# Patient Record
Sex: Female | Born: 1955 | Race: White | Hispanic: No
Health system: Southern US, Community
[De-identification: ages and names within clinical notes are randomized; demographics above are authoritative.]

---

## 2017-04-12 ENCOUNTER — Other Ambulatory Visit (HOSPITAL_COMMUNITY): Payer: Medicare Other

## 2017-04-12 ENCOUNTER — Ambulatory Visit (HOSPITAL_COMMUNITY)
Admission: AD | Admit: 2017-04-12 | Discharge: 2017-04-12 | Disposition: A | Discharge status: Home / Self Care | Payer: Medicare Other | Source: Other Acute Inpatient Hospital | Attending: Internal Medicine | Admitting: Internal Medicine

## 2017-04-12 ENCOUNTER — Inpatient Hospital Stay
Admission: AD | Admit: 2017-04-12 | Discharge: 2017-05-19 | Disposition: A | Discharge status: Skilled Nursing Facility | Payer: Medicare Other | Source: Ambulatory Visit | Attending: Internal Medicine | Admitting: Internal Medicine

## 2017-04-12 DIAGNOSIS — J9690 Respiratory failure, unspecified, unspecified whether with hypoxia or hypercapnia: Secondary | ICD-10-CM

## 2017-04-12 DIAGNOSIS — R0603 Acute respiratory distress: Secondary | ICD-10-CM

## 2017-04-12 DIAGNOSIS — Z4659 Encounter for fitting and adjustment of other gastrointestinal appliance and device: Secondary | ICD-10-CM

## 2017-04-12 DIAGNOSIS — Z0189 Encounter for other specified special examinations: Secondary | ICD-10-CM

## 2017-04-12 DIAGNOSIS — R092 Respiratory arrest: Secondary | ICD-10-CM | POA: Diagnosis present

## 2017-04-12 DIAGNOSIS — Z978 Presence of other specified devices: Secondary | ICD-10-CM

## 2017-04-12 DIAGNOSIS — Z431 Encounter for attention to gastrostomy: Secondary | ICD-10-CM

## 2017-04-12 DIAGNOSIS — R109 Unspecified abdominal pain: Secondary | ICD-10-CM

## 2017-04-12 LAB — VANCOMYCIN, RANDOM: VANCOMYCIN RM: 8

## 2017-04-12 LAB — BLOOD GAS, ARTERIAL
ACID-BASE DEFICIT: 4.7 mmol/L — AB (ref 0.0–2.0)
BICARBONATE: 19.2 mmol/L — AB (ref 20.0–28.0)
FIO2: 28
MECHVT: 500 mL
O2 SAT: 96.9 %
PCO2 ART: 31.4 mmHg — AB (ref 32.0–48.0)
PEEP: 5 cmH2O
PO2 ART: 87.8 mmHg (ref 83.0–108.0)
Patient temperature: 98.6
pH, Arterial: 7.404 (ref 7.350–7.450)

## 2017-04-13 LAB — CBC WITH DIFFERENTIAL/PLATELET
BASOS PCT: 0 %
Basophils Absolute: 0 10*3/uL (ref 0.0–0.1)
EOS PCT: 5 %
Eosinophils Absolute: 0.6 10*3/uL (ref 0.0–0.7)
HEMATOCRIT: 29.2 % — AB (ref 36.0–46.0)
Hemoglobin: 9.2 g/dL — ABNORMAL LOW (ref 12.0–15.0)
LYMPHS PCT: 17 %
Lymphs Abs: 2 10*3/uL (ref 0.7–4.0)
MCH: 27.5 pg (ref 26.0–34.0)
MCHC: 31.5 g/dL (ref 30.0–36.0)
MCV: 87.2 fL (ref 78.0–100.0)
Monocytes Absolute: 0.8 10*3/uL (ref 0.1–1.0)
Monocytes Relative: 7 %
NEUTROS ABS: 8.5 10*3/uL — AB (ref 1.7–7.7)
NEUTROS PCT: 71 %
Platelets: 313 10*3/uL (ref 150–400)
RBC: 3.35 MIL/uL — ABNORMAL LOW (ref 3.87–5.11)
RDW: 14.1 % (ref 11.5–15.5)
WBC: 11.9 10*3/uL — ABNORMAL HIGH (ref 4.0–10.5)

## 2017-04-13 LAB — C DIFFICILE QUICK SCREEN W PCR REFLEX
C DIFFICILE (CDIFF) TOXIN: NEGATIVE
C DIFFICLE (CDIFF) ANTIGEN: POSITIVE — AB

## 2017-04-13 LAB — COMPREHENSIVE METABOLIC PANEL
ALBUMIN: 2.2 g/dL — AB (ref 3.5–5.0)
ALK PHOS: 62 U/L (ref 38–126)
ALT: 25 U/L (ref 14–54)
ANION GAP: 9 (ref 5–15)
AST: 19 U/L (ref 15–41)
BUN: <5 mg/dL — ABNORMAL LOW (ref 6–20)
CHLORIDE: 113 mmol/L — AB (ref 101–111)
CO2: 20 mmol/L — AB (ref 22–32)
CREATININE: 0.69 mg/dL (ref 0.44–1.00)
Calcium: 7.9 mg/dL — ABNORMAL LOW (ref 8.9–10.3)
GFR calc non Af Amer: >60 mL/min (ref >60)
GLUCOSE: 121 mg/dL — AB (ref 65–99)
Potassium: 3.5 mmol/L (ref 3.5–5.1)
SODIUM: 142 mmol/L (ref 135–145)
Total Bilirubin: 0.6 mg/dL (ref 0.3–1.2)
Total Protein: 5.1 g/dL — ABNORMAL LOW (ref 6.5–8.1)

## 2017-04-13 LAB — PROTIME-INR
INR: 1.26
Prothrombin Time: 15.9 seconds — ABNORMAL HIGH (ref 11.4–15.2)

## 2017-04-13 LAB — CLOSTRIDIUM DIFFICILE BY PCR: CDIFFPCR: POSITIVE — AB

## 2017-04-14 LAB — VANCOMYCIN, TROUGH: Vancomycin Tr: 20 ug/mL (ref 15–20)

## 2017-04-15 LAB — BASIC METABOLIC PANEL
Anion gap: 10 (ref 5–15)
BUN: 6 mg/dL (ref 6–20)
CALCIUM: 8.4 mg/dL — AB (ref 8.9–10.3)
CO2: 22 mmol/L (ref 22–32)
CREATININE: 1.38 mg/dL — AB (ref 0.44–1.00)
Chloride: 116 mmol/L — ABNORMAL HIGH (ref 101–111)
GFR calc non Af Amer: 40 mL/min — ABNORMAL LOW (ref >60)
GFR, EST AFRICAN AMERICAN: 46 mL/min — AB (ref >60)
Glucose, Bld: 125 mg/dL — ABNORMAL HIGH (ref 65–99)
Potassium: 3.9 mmol/L (ref 3.5–5.1)
Sodium: 148 mmol/L — ABNORMAL HIGH (ref 135–145)

## 2017-04-15 LAB — CBC WITH DIFFERENTIAL/PLATELET
BASOS PCT: 0 %
Basophils Absolute: 0 10*3/uL (ref 0.0–0.1)
EOS ABS: 0.6 10*3/uL (ref 0.0–0.7)
Eosinophils Relative: 6 %
HCT: 33.9 % — ABNORMAL LOW (ref 36.0–46.0)
Hemoglobin: 10.7 g/dL — ABNORMAL LOW (ref 12.0–15.0)
Lymphocytes Relative: 15 %
Lymphs Abs: 1.4 10*3/uL (ref 0.7–4.0)
MCH: 27.9 pg (ref 26.0–34.0)
MCHC: 31.6 g/dL (ref 30.0–36.0)
MCV: 88.5 fL (ref 78.0–100.0)
MONO ABS: 1 10*3/uL (ref 0.1–1.0)
Monocytes Relative: 10 %
NEUTROS PCT: 69 %
Neutro Abs: 6.5 10*3/uL (ref 1.7–7.7)
PLATELETS: 306 10*3/uL (ref 150–400)
RBC: 3.83 MIL/uL — AB (ref 3.87–5.11)
RDW: 14.8 % (ref 11.5–15.5)
WBC: 9.5 10*3/uL (ref 4.0–10.5)

## 2017-04-15 LAB — MAGNESIUM: MAGNESIUM: 1.9 mg/dL (ref 1.7–2.4)

## 2017-04-16 LAB — VANCOMYCIN, TROUGH: VANCOMYCIN TR: 45 ug/mL — AB (ref 15–20)

## 2017-04-18 LAB — BLOOD GAS, ARTERIAL
Acid-Base Excess: 2.9 mmol/L — ABNORMAL HIGH (ref 0.0–2.0)
Bicarbonate: 27.4 mmol/L (ref 20.0–28.0)
FIO2: 28
MECHVT: 450 mL
O2 SAT: 95 %
PATIENT TEMPERATURE: 99.6
PCO2 ART: 46.4 mmHg (ref 32.0–48.0)
PEEP: 5 cmH2O
PH ART: 7.392 (ref 7.350–7.450)
PO2 ART: 86.6 mmHg (ref 83.0–108.0)
RATE: 16 resp/min

## 2017-04-18 LAB — BASIC METABOLIC PANEL
Anion gap: 10 (ref 5–15)
BUN: 8 mg/dL (ref 6–20)
CHLORIDE: 114 mmol/L — AB (ref 101–111)
CO2: 28 mmol/L (ref 22–32)
CREATININE: 1.9 mg/dL — AB (ref 0.44–1.00)
Calcium: 8.6 mg/dL — ABNORMAL LOW (ref 8.9–10.3)
GFR calc Af Amer: 31 mL/min — ABNORMAL LOW (ref >60)
GFR calc non Af Amer: 27 mL/min — ABNORMAL LOW (ref >60)
GLUCOSE: 126 mg/dL — AB (ref 65–99)
POTASSIUM: 3 mmol/L — AB (ref 3.5–5.1)
Sodium: 152 mmol/L — ABNORMAL HIGH (ref 135–145)

## 2017-04-18 LAB — CBC
HCT: 29.2 % — ABNORMAL LOW (ref 36.0–46.0)
HEMOGLOBIN: 8.8 g/dL — AB (ref 12.0–15.0)
MCH: 26.8 pg (ref 26.0–34.0)
MCHC: 30.1 g/dL (ref 30.0–36.0)
MCV: 89 fL (ref 78.0–100.0)
Platelets: 317 10*3/uL (ref 150–400)
RBC: 3.28 MIL/uL — AB (ref 3.87–5.11)
RDW: 14.5 % (ref 11.5–15.5)
WBC: 8.7 10*3/uL (ref 4.0–10.5)

## 2017-04-18 LAB — VANCOMYCIN, TROUGH: VANCOMYCIN TR: 15 ug/mL (ref 15–20)

## 2017-04-19 LAB — BASIC METABOLIC PANEL
Anion gap: 15 (ref 5–15)
BUN: 8 mg/dL (ref 6–20)
CO2: 22 mmol/L (ref 22–32)
CREATININE: 1.89 mg/dL — AB (ref 0.44–1.00)
Calcium: 8.6 mg/dL — ABNORMAL LOW (ref 8.9–10.3)
Chloride: 115 mmol/L — ABNORMAL HIGH (ref 101–111)
GFR calc non Af Amer: 27 mL/min — ABNORMAL LOW (ref >60)
GFR, EST AFRICAN AMERICAN: 32 mL/min — AB (ref >60)
Glucose, Bld: 117 mg/dL — ABNORMAL HIGH (ref 65–99)
Potassium: 4.4 mmol/L (ref 3.5–5.1)
SODIUM: 152 mmol/L — AB (ref 135–145)

## 2017-04-20 LAB — BASIC METABOLIC PANEL
Anion gap: 9 (ref 5–15)
BUN: 11 mg/dL (ref 6–20)
CHLORIDE: 109 mmol/L (ref 101–111)
CO2: 28 mmol/L (ref 22–32)
Calcium: 8.2 mg/dL — ABNORMAL LOW (ref 8.9–10.3)
Creatinine, Ser: 1.67 mg/dL — ABNORMAL HIGH (ref 0.44–1.00)
GFR calc Af Amer: 37 mL/min — ABNORMAL LOW (ref >60)
GFR calc non Af Amer: 32 mL/min — ABNORMAL LOW (ref >60)
GLUCOSE: 129 mg/dL — AB (ref 65–99)
POTASSIUM: 2.7 mmol/L — AB (ref 3.5–5.1)
Sodium: 146 mmol/L — ABNORMAL HIGH (ref 135–145)

## 2017-04-20 LAB — VANCOMYCIN, TROUGH: VANCOMYCIN TR: 35 ug/mL — AB (ref 15–20)

## 2017-04-21 ENCOUNTER — Other Ambulatory Visit (HOSPITAL_COMMUNITY): Payer: Medicare Other

## 2017-04-21 LAB — POTASSIUM: Potassium: 3.5 mmol/L (ref 3.5–5.1)

## 2017-04-21 LAB — VANCOMYCIN, TROUGH: VANCOMYCIN TR: 16 ug/mL (ref 15–20)

## 2017-04-22 ENCOUNTER — Other Ambulatory Visit (HOSPITAL_COMMUNITY): Payer: Medicare Other

## 2017-04-22 LAB — CBC
HEMATOCRIT: 29.1 % — AB (ref 36.0–46.0)
Hemoglobin: 9.1 g/dL — ABNORMAL LOW (ref 12.0–15.0)
MCH: 27.2 pg (ref 26.0–34.0)
MCHC: 31.3 g/dL (ref 30.0–36.0)
MCV: 87.1 fL (ref 78.0–100.0)
PLATELETS: 321 10*3/uL (ref 150–400)
RBC: 3.34 MIL/uL — AB (ref 3.87–5.11)
RDW: 14.2 % (ref 11.5–15.5)
WBC: 8.1 10*3/uL (ref 4.0–10.5)

## 2017-04-22 LAB — BASIC METABOLIC PANEL
ANION GAP: 10 (ref 5–15)
BUN: 18 mg/dL (ref 6–20)
CO2: 26 mmol/L (ref 22–32)
Calcium: 9 mg/dL (ref 8.9–10.3)
Chloride: 110 mmol/L (ref 101–111)
Creatinine, Ser: 1.57 mg/dL — ABNORMAL HIGH (ref 0.44–1.00)
GFR calc Af Amer: 39 mL/min — ABNORMAL LOW (ref >60)
GFR, EST NON AFRICAN AMERICAN: 34 mL/min — AB (ref >60)
GLUCOSE: 116 mg/dL — AB (ref 65–99)
POTASSIUM: 3.3 mmol/L — AB (ref 3.5–5.1)
Sodium: 146 mmol/L — ABNORMAL HIGH (ref 135–145)

## 2017-04-22 LAB — TSH: TSH: 1.404 u[IU]/mL (ref 0.350–4.500)

## 2017-04-23 NOTE — Anesthesia Preprocedure Evaluation (Addendum)
Anesthesia Evaluation  Patient identified by MRN, date of birth, ID band Patient unresponsive    Reviewed: Allergy & Precautions, H&P , NPO status , Patient's Chart, lab work & pertinent test results  Airway Mallampati: Intubated       Dental no notable dental hx. (+) Teeth Intact, Dental Advisory Given   Pulmonary neg pulmonary ROS,  VDRF   Pulmonary exam normal breath sounds clear to auscultation       Cardiovascular Exercise Tolerance: Good negative cardio ROS   Rhythm:Regular Rate:Normal     Neuro/Psych PSYCHIATRIC DISORDERS Depression Bipolar Disorder negative neurological ROS     GI/Hepatic negative GI ROS, Neg liver ROS,   Endo/Other  negative endocrine ROS  Renal/GU negative Renal ROS  negative genitourinary   Musculoskeletal   Abdominal   Peds  Hematology negative hematology ROS (+)   Anesthesia Other Findings   Reproductive/Obstetrics negative OB ROS                            Anesthesia Physical Anesthesia Plan  ASA: IV  Anesthesia Plan: General   Post-op Pain Management:    Induction: Intravenous  PONV Risk Score and Plan: 3 and Ondansetron, Dexamethasone and Midazolam  Airway Management Planned: Tracheostomy  Additional Equipment:   Intra-op Plan:   Post-operative Plan: Post-operative intubation/ventilation  Informed Consent: I have reviewed the patients History and Physical, chart, labs and discussed the procedure including the risks, benefits and alternatives for the proposed anesthesia with the patient or authorized representative who has indicated his/her understanding and acceptance.   Dental advisory given  Plan Discussed with: CRNA  Anesthesia Plan Comments:        Anesthesia Quick Evaluation

## 2017-04-24 ENCOUNTER — Encounter
Admission: AD | Disposition: A | Discharge status: Skilled Nursing Facility | Payer: Self-pay | Source: Ambulatory Visit | Attending: Internal Medicine

## 2017-04-24 ENCOUNTER — Encounter (HOSPITAL_COMMUNITY): Payer: Medicare Other | Admitting: Anesthesiology

## 2017-04-24 HISTORY — PX: TRACHEOSTOMY TUBE PLACEMENT: SHX814

## 2017-04-24 LAB — CBC
HCT: 31 % — ABNORMAL LOW (ref 36.0–46.0)
Hemoglobin: 9.6 g/dL — ABNORMAL LOW (ref 12.0–15.0)
MCH: 27.4 pg (ref 26.0–34.0)
MCHC: 31 g/dL (ref 30.0–36.0)
MCV: 88.3 fL (ref 78.0–100.0)
PLATELETS: 440 10*3/uL — AB (ref 150–400)
RBC: 3.51 MIL/uL — AB (ref 3.87–5.11)
RDW: 14.3 % (ref 11.5–15.5)
WBC: 10.6 10*3/uL — AB (ref 4.0–10.5)

## 2017-04-24 LAB — PROTIME-INR
INR: 1.11
PROTHROMBIN TIME: 14.3 s (ref 11.4–15.2)

## 2017-04-24 LAB — BASIC METABOLIC PANEL
ANION GAP: 10 (ref 5–15)
BUN: 19 mg/dL (ref 6–20)
CO2: 28 mmol/L (ref 22–32)
Calcium: 9 mg/dL (ref 8.9–10.3)
Chloride: 108 mmol/L (ref 101–111)
Creatinine, Ser: 1.52 mg/dL — ABNORMAL HIGH (ref 0.44–1.00)
GFR calc Af Amer: 41 mL/min — ABNORMAL LOW (ref >60)
GFR, EST NON AFRICAN AMERICAN: 35 mL/min — AB (ref >60)
Glucose, Bld: 117 mg/dL — ABNORMAL HIGH (ref 65–99)
POTASSIUM: 3.4 mmol/L — AB (ref 3.5–5.1)
SODIUM: 146 mmol/L — AB (ref 135–145)

## 2017-04-24 SURGERY — CREATION, TRACHEOSTOMY
Anesthesia: General | Site: Neck

## 2017-04-24 MED ORDER — ONDANSETRON HCL 4 MG/2ML IJ SOLN
INTRAMUSCULAR | Status: DC | PRN
  Administered 2017-04-24: 4 mg via INTRAVENOUS

## 2017-04-24 MED ORDER — PROPOFOL 10 MG/ML IV BOLUS
INTRAVENOUS | Status: DC | PRN
  Administered 2017-04-24 (×2): 50 mg via INTRAVENOUS

## 2017-04-24 MED ORDER — SODIUM CHLORIDE 0.9 % IV SOLN
INTRAVENOUS | Status: DC | PRN
  Administered 2017-04-24: 08:00:00 via INTRAVENOUS

## 2017-04-24 MED ORDER — PHENYLEPHRINE 40 MCG/ML (10ML) SYRINGE FOR IV PUSH (FOR BLOOD PRESSURE SUPPORT)
PREFILLED_SYRINGE | INTRAVENOUS | Status: AC
  Filled 2017-04-24: qty 10

## 2017-04-24 MED ORDER — PHENYLEPHRINE 40 MCG/ML (10ML) SYRINGE FOR IV PUSH (FOR BLOOD PRESSURE SUPPORT)
PREFILLED_SYRINGE | INTRAVENOUS | Status: DC | PRN
  Administered 2017-04-24: 80 ug via INTRAVENOUS

## 2017-04-24 MED ORDER — LIDOCAINE-EPINEPHRINE (PF) 1 %-1:200000 IJ SOLN
INTRAMUSCULAR | Status: DC | PRN
  Administered 2017-04-24: 30 mL

## 2017-04-24 MED ORDER — MIDAZOLAM HCL 2 MG/2ML IJ SOLN
INTRAMUSCULAR | Status: DC | PRN
  Administered 2017-04-24: 2 mg via INTRAVENOUS

## 2017-04-24 MED ORDER — MIDAZOLAM HCL 2 MG/2ML IJ SOLN
INTRAMUSCULAR | Status: AC
Start: 2017-04-24 — End: ?
  Filled 2017-04-24: qty 2

## 2017-04-24 MED ORDER — ROCURONIUM BROMIDE 10 MG/ML (PF) SYRINGE
PREFILLED_SYRINGE | INTRAVENOUS | Status: AC
  Filled 2017-04-24: qty 5

## 2017-04-24 MED ORDER — PROPOFOL 10 MG/ML IV BOLUS
INTRAVENOUS | Status: AC
  Filled 2017-04-24: qty 20

## 2017-04-24 MED ORDER — FENTANYL CITRATE (PF) 250 MCG/5ML IJ SOLN
INTRAMUSCULAR | Status: AC
  Filled 2017-04-24: qty 5

## 2017-04-24 MED ORDER — LIDOCAINE 2% (20 MG/ML) 5 ML SYRINGE
INTRAMUSCULAR | Status: AC
  Filled 2017-04-24: qty 5

## 2017-04-24 MED ORDER — ROCURONIUM BROMIDE 10 MG/ML (PF) SYRINGE
PREFILLED_SYRINGE | INTRAVENOUS | Status: DC | PRN
  Administered 2017-04-24: 50 mg via INTRAVENOUS

## 2017-04-24 MED ORDER — 0.9 % SODIUM CHLORIDE (POUR BTL) OPTIME
TOPICAL | Status: DC | PRN
  Administered 2017-04-24: 1000 mL

## 2017-04-24 MED ORDER — FENTANYL CITRATE (PF) 250 MCG/5ML IJ SOLN
INTRAMUSCULAR | Status: DC | PRN
  Administered 2017-04-24 (×2): 25 ug via INTRAVENOUS
  Administered 2017-04-24 (×2): 50 ug via INTRAVENOUS

## 2017-04-24 SURGICAL SUPPLY — 38 items
ATTRACTOMAT 16X20 MAGNETIC DRP (DRAPES) IMPLANT
BLADE SURG 15 STRL LF DISP TIS (BLADE) ×1 IMPLANT
BLADE SURG 15 STRL SS (BLADE) ×2
CLEANER TIP ELECTROSURG 2X2 (MISCELLANEOUS) ×3 IMPLANT
COVER SURGICAL LIGHT HANDLE (MISCELLANEOUS) ×3 IMPLANT
DRAPE HALF SHEET 40X57 (DRAPES) IMPLANT
ELECT COATED BLADE 2.86 ST (ELECTRODE) ×3 IMPLANT
ELECT REM PT RETURN 9FT ADLT (ELECTROSURGICAL) ×3
ELECTRODE REM PT RTRN 9FT ADLT (ELECTROSURGICAL) ×1 IMPLANT
GAUZE SPONGE 4X4 16PLY XRAY LF (GAUZE/BANDAGES/DRESSINGS) ×3 IMPLANT
GEL ULTRASOUND 20GR AQUASONIC (MISCELLANEOUS) ×3 IMPLANT
GLOVE SS BIOGEL STRL SZ 7.5 (GLOVE) ×1 IMPLANT
GLOVE SUPERSENSE BIOGEL SZ 7.5 (GLOVE) ×2
GOWN STRL REUS W/ TWL LRG LVL3 (GOWN DISPOSABLE) ×1 IMPLANT
GOWN STRL REUS W/ TWL XL LVL3 (GOWN DISPOSABLE) ×1 IMPLANT
GOWN STRL REUS W/TWL LRG LVL3 (GOWN DISPOSABLE) ×2
GOWN STRL REUS W/TWL XL LVL3 (GOWN DISPOSABLE) ×2
HOLDER TRACH TUBE VELCRO 19.5 (MISCELLANEOUS) ×3 IMPLANT
KIT BASIN OR (CUSTOM PROCEDURE TRAY) ×3 IMPLANT
KIT ROOM TURNOVER OR (KITS) ×3 IMPLANT
KIT SUCTION CATH 14FR (SUCTIONS) ×3 IMPLANT
NEEDLE HYPO 25GX1X1/2 BEV (NEEDLE) ×3 IMPLANT
NS IRRIG 1000ML POUR BTL (IV SOLUTION) ×3 IMPLANT
PACK EENT II TURBAN DRAPE (CUSTOM PROCEDURE TRAY) ×3 IMPLANT
PAD ARMBOARD 7.5X6 YLW CONV (MISCELLANEOUS) IMPLANT
PENCIL BUTTON HOLSTER BLD 10FT (ELECTRODE) ×3 IMPLANT
SPONGE DRAIN TRACH 4X4 STRL 2S (GAUZE/BANDAGES/DRESSINGS) ×3 IMPLANT
SPONGE INTESTINAL PEANUT (DISPOSABLE) ×3 IMPLANT
SUT SILK 2 0 SH CR/8 (SUTURE) ×3 IMPLANT
SUT SILK 3 0 TIES 10X30 (SUTURE) IMPLANT
SYR 5ML LUER SLIP (SYRINGE) ×3 IMPLANT
SYR CONTROL 10ML LL (SYRINGE) ×3 IMPLANT
TOWEL OR 17X24 6PK STRL BLUE (TOWEL DISPOSABLE) ×3 IMPLANT
TOWEL OR 17X26 10 PK STRL BLUE (TOWEL DISPOSABLE) ×3 IMPLANT
TUBE CONNECTING 12'X1/4 (SUCTIONS) ×1
TUBE CONNECTING 12X1/4 (SUCTIONS) ×2 IMPLANT
TUBE TRACH SHILEY  6 DIST  CUF (TUBING) ×3 IMPLANT
TUBE TRACH SHILEY 8 DIST CUF (TUBING) IMPLANT

## 2017-04-24 NOTE — Transfer of Care (Addendum)
Immediate Anesthesia Transfer of Care Note  Patient: Suzanne Barrera  Procedure(s) Performed: Procedure(s): TRACHEOSTOMY (N/A)  [facility] Location[facility]  Anesthesia Type:General  Level of Consciousness: drowsy and patient cooperative  Airway & Oxygen Therapy: Patient remains intubated per anesthesia plan and Patient placed on Ventilator (see vital sign flow sheet for setting)  Post-op Assessment: Report given to RN, Post -op Vital signs reviewed and stable and Patient moving all extremities X 4  Post vital signs: Reviewed and stable  Last Vitals: There were no vitals filed for this visit.  Last Pain: There were no vitals filed for this visit.       Complications: No apparent anesthesia complications

## 2017-04-24 NOTE — H&P (Signed)
PREOPERATIVE H&P  Chief Complaint: Acute respiratory failure  HPI: Suzanne Barrera is a 61 y.o. female who presents for evaluation for tracheostomy. She has bipolar disorder with history of multiple suicide attempts. She recently tool overdose of medication in [place] requiring hospitalization and developed MRSA pneumonia. She was transferred to [facility] 8/1 intubated on vent. It was recommended that she has a trach placed and family is agreeable as she's not totally responsive.   No past medical history on file. No past surgical history on file. Social History   Social History  . Marital status: Widowed    Spouse name: N/A  . Number of children: N/A  . Years of education: N/A   Social History Main Topics  . Smoking status: Not on file  . Smokeless tobacco: Not on file  . Alcohol use Not on file  . Drug use: Unknown  . Sexual activity: Not on file   Other Topics Concern  . Not on file   Social History Narrative  . No narrative on file   No family history on file. Allergies not on file Prior to Admission medications   Not on File     Positive ROS: neg  All other systems have been reviewed and were otherwise negative with the exception of those mentioned in the HPI and as above.  Physical Exam: There were no vitals filed for this visit.  General: Intubated on vent Neck: No palpable adenopathy or thyroid nodules Cardiovascular: Regular rate and rhythm, no murmur.  Respiratory: Clear to auscultation Neurologic: Responsive but with encephalopathy   Assessment/Plan: respiratory failure Plan for Procedure(s): TRACHEOSTOMY   Dillard CannonHRISTOPHER Traycen Goyer, MD 04/24/2017 7:35 AM

## 2017-04-24 NOTE — Brief Op Note (Signed)
04/12/2017 - 04/24/2017  8:38 AM  PATIENT:  Suzanne Barrera  61 y.o. female  PRE-OPERATIVE DIAGNOSIS:  respiratory failure  POST-OPERATIVE DIAGNOSIS:  respiratory failure  PROCEDURE:  Procedure(s): TRACHEOSTOMY (N/A) #6 Shiley  SURGEON:  Surgeon(s) and Role:    Drema Halon* Natan Hartog E, MD - Primary  PHYSICIAN ASSISTANT:   ASSISTANTS: none   ANESTHESIA:   general  EBL:  No intake/output data recorded.  BLOOD ADMINISTERED:none  DRAINS: none   LOCAL MEDICATIONS USED:  XYLOCAINE with EPI 5 cc  SPECIMEN:  No Specimen  DISPOSITION OF SPECIMEN:  N/A  COUNTS:  YES  TOURNIQUET:  * No tourniquets in log *  DICTATION: .Other Dictation: Dictation Number [id number]  PLAN OF CARE: Discharge to home after PACU  PATIENT DISPOSITION:  PACU - hemodynamically stable.   Delay start of Pharmacological VTE agent (>24hrs) due to surgical blood loss or risk of bleeding: yes

## 2017-04-24 NOTE — OR Nursing (Signed)
0845: [facility] per crna x2/rn without incident. Trach obturator placed in visible peel pack, labeled, taped to wall over pt bed per or rn.

## 2017-04-24 NOTE — Interval H&P Note (Signed)
History and Physical Interval Note:  04/24/2017 7:42 AM  Suzanne Barrera  has presented today for surgery, with the diagnosis of respiratory failure  The various methods of treatment have been discussed with the patient and family. After consideration of risks, benefits and other options for treatment, the patient has consented to  Procedure(s): TRACHEOSTOMY (N/A) as a surgical intervention .  The patient's history has been reviewed, patient examined, no change in status, stable for surgery.  I have reviewed the patient's chart and labs.  Questions were answered to the patient's satisfaction.     CHRISTOPHER NEWMAN

## 2017-04-24 NOTE — Op Note (Signed)
NAMMercer Pod:  Barrera, Suzanne               ACCOUNT NO.:  [id number]  MEDICAL RECORD NO.:  [id number]  LOCATION:                                 FACILITY:  PHYSICIAN:  Kristine GarbeChristopher E. Ezzard StandingNewman, M.D. DATE OF BIRTH:  DATE OF PROCEDURE:  04/24/2017 DATE OF DISCHARGE:                              OPERATIVE REPORT   PREOPERATIVE DIAGNOSIS:  Acute respiratory failure.  POSTOPERATIVE DIAGNOSIS:  Acute respiratory failure.  OPERATION PERFORMED:  Tracheotomy with a #6 Shiley cuffed trach.  SURGEON:  Kristine GarbeChristopher E. Ezzard StandingNewman, M.D.  ANESTHESIA:  General.  COMPLICATIONS:  None.  BRIEF CLINICAL NOTE:  Suzanne HeadingsBenita Barrera is a 61 year old female with history of bipolar disorder as well as seizure disorder.  She has had history of multiple suicide attempts in the past and was found in [place], [place], unconscious having taken excessive medications consistent with overdose.  She subsequently developed MRSA pneumonia at the outside hospital and was subsequently [facility] 12, 2017, for long-term care.  She has been on the ventilator. It is recommended that she undergo tracheotomy and is taken to the operating room at this time for a tracheotomy.  DESCRIPTION OF PROCEDURE:  The patient was [facility] down to the OR, remained in her bed.  A roll was placed underneath her shoulders to extend her neck.  The trachea was midline with no surrounding masses.  Area was prepped with Betadine solution and draped out with sterile towels.  The area was injected with 5 mL of Xylocaine with epinephrine for hemostasis and local anesthetic. A vertical incision was made just above the suprasternal notch at midline.  Dissection was carried down through subcutaneous tissue with cautery.  The strap muscles were identified and divided in midline and retracted laterally.  The cricoid cartilage was identified and the first three tracheal rings were  exposed having divided the thyroid isthmus with the cautery.  A horizontal tracheotomy was performed between the first and second tracheal rings and a #6 Shiley cuffed tube was inserted after withdrawing the endotracheal tube.  The patient was ventilated well.  The trach was secured to the neck with 2-0 silk sutures x4 and Velcro trach collar around the neck.  The patient was subsequently [facility] with a #6 cuffed Shiley trach in place functioning well.    ______________________________ Kristine Garbehristopher E. Ezzard StandingNewman, M.D.   ______________________________ Kristine Garbehristopher E. Ezzard StandingNewman, M.D.    CEN/MEDQ  D:  04/24/2017  T:  04/24/2017  Job:  [id number]

## 2017-04-24 NOTE — Anesthesia Postprocedure Evaluation (Signed)
Anesthesia Post Note  Patient: Suzanne Barrera  Procedure(s) Performed: Procedure(s) (LRB): TRACHEOSTOMY (N/A)     [facility]. Anesthesia Type: General Level of consciousness: sedated and patient remains intubated per anesthesia plan Pain management: pain level controlled Vital Signs Assessment: post-procedure vital signs reviewed and stable Respiratory status: respiratory function stable, nonlabored ventilation and patient on ventilator - see flowsheet for VS Cardiovascular status: blood pressure returned to baseline and stable Postop Assessment: no signs of nausea or vomiting Anesthetic complications: no    Last Vitals: There were no vitals filed for this visit.  Last Pain: There were no vitals filed for this visit.               Ubah Radke,W. EDMOND

## 2017-04-25 ENCOUNTER — Encounter (HOSPITAL_COMMUNITY): Payer: Self-pay | Admitting: Otolaryngology

## 2017-04-28 ENCOUNTER — Other Ambulatory Visit (HOSPITAL_COMMUNITY): Payer: Medicare Other

## 2017-04-29 ENCOUNTER — Other Ambulatory Visit (HOSPITAL_COMMUNITY): Payer: Medicare Other

## 2017-04-30 LAB — BASIC METABOLIC PANEL
ANION GAP: 12 (ref 5–15)
BUN: 30 mg/dL — ABNORMAL HIGH (ref 6–20)
CALCIUM: 9.2 mg/dL (ref 8.9–10.3)
CO2: 27 mmol/L (ref 22–32)
Chloride: 108 mmol/L (ref 101–111)
Creatinine, Ser: 1.48 mg/dL — ABNORMAL HIGH (ref 0.44–1.00)
GFR, EST AFRICAN AMERICAN: 42 mL/min — AB (ref >60)
GFR, EST NON AFRICAN AMERICAN: 37 mL/min — AB (ref >60)
Glucose, Bld: 126 mg/dL — ABNORMAL HIGH (ref 65–99)
Potassium: 4.2 mmol/L (ref 3.5–5.1)
SODIUM: 147 mmol/L — AB (ref 135–145)

## 2017-04-30 LAB — CBC WITH DIFFERENTIAL/PLATELET
BASOS ABS: 0.1 10*3/uL (ref 0.0–0.1)
BASOS PCT: 0 %
EOS ABS: 0.9 10*3/uL — AB (ref 0.0–0.7)
Eosinophils Relative: 7 %
HEMATOCRIT: 34.5 % — AB (ref 36.0–46.0)
HEMOGLOBIN: 10.5 g/dL — AB (ref 12.0–15.0)
Lymphocytes Relative: 20 %
Lymphs Abs: 2.7 10*3/uL (ref 0.7–4.0)
MCH: 27 pg (ref 26.0–34.0)
MCHC: 30.4 g/dL (ref 30.0–36.0)
MCV: 88.7 fL (ref 78.0–100.0)
Monocytes Absolute: 1.3 10*3/uL — ABNORMAL HIGH (ref 0.1–1.0)
Monocytes Relative: 9 %
NEUTROS ABS: 8.7 10*3/uL — AB (ref 1.7–7.7)
NEUTROS PCT: 64 %
Platelets: 769 10*3/uL — ABNORMAL HIGH (ref 150–400)
RBC: 3.89 MIL/uL (ref 3.87–5.11)
RDW: 14.1 % (ref 11.5–15.5)
WBC: 13.7 10*3/uL — AB (ref 4.0–10.5)

## 2017-05-01 LAB — BASIC METABOLIC PANEL
Anion gap: 10 (ref 5–15)
BUN: 34 mg/dL — AB (ref 6–20)
CHLORIDE: 111 mmol/L (ref 101–111)
CO2: 25 mmol/L (ref 22–32)
CREATININE: 1.5 mg/dL — AB (ref 0.44–1.00)
Calcium: 9 mg/dL (ref 8.9–10.3)
GFR calc non Af Amer: 36 mL/min — ABNORMAL LOW (ref >60)
GFR, EST AFRICAN AMERICAN: 42 mL/min — AB (ref >60)
Glucose, Bld: 127 mg/dL — ABNORMAL HIGH (ref 65–99)
POTASSIUM: 6 mmol/L — AB (ref 3.5–5.1)
Sodium: 146 mmol/L — ABNORMAL HIGH (ref 135–145)

## 2017-05-02 ENCOUNTER — Other Ambulatory Visit (HOSPITAL_COMMUNITY): Payer: Medicare Other

## 2017-05-02 LAB — POTASSIUM: POTASSIUM: 3.6 mmol/L (ref 3.5–5.1)

## 2017-05-03 LAB — BASIC METABOLIC PANEL
ANION GAP: 8 (ref 5–15)
BUN: 22 mg/dL — AB (ref 6–20)
CALCIUM: 8.8 mg/dL — AB (ref 8.9–10.3)
CO2: 26 mmol/L (ref 22–32)
Chloride: 111 mmol/L (ref 101–111)
Creatinine, Ser: 1.22 mg/dL — ABNORMAL HIGH (ref 0.44–1.00)
GFR calc Af Amer: 53 mL/min — ABNORMAL LOW (ref >60)
GFR, EST NON AFRICAN AMERICAN: 46 mL/min — AB (ref >60)
GLUCOSE: 140 mg/dL — AB (ref 65–99)
Potassium: 3.5 mmol/L (ref 3.5–5.1)
Sodium: 145 mmol/L (ref 135–145)

## 2017-05-03 LAB — CBC
HCT: 33 % — ABNORMAL LOW (ref 36.0–46.0)
HEMOGLOBIN: 10 g/dL — AB (ref 12.0–15.0)
MCH: 26.5 pg (ref 26.0–34.0)
MCHC: 30.3 g/dL (ref 30.0–36.0)
MCV: 87.5 fL (ref 78.0–100.0)
Platelets: 652 10*3/uL — ABNORMAL HIGH (ref 150–400)
RBC: 3.77 MIL/uL — ABNORMAL LOW (ref 3.87–5.11)
RDW: 13.8 % (ref 11.5–15.5)
WBC: 14.2 10*3/uL — AB (ref 4.0–10.5)

## 2017-05-03 LAB — APTT: APTT: 31 s (ref 24–36)

## 2017-05-03 LAB — PROTIME-INR
INR: 1.07
Prothrombin Time: 14 seconds (ref 11.4–15.2)

## 2017-05-03 NOTE — Consult Note (Signed)
Chief Complaint: Patient was seen in consultation today for percutaneous gastric tube placement at the request of Dr Ardeth Sportsman  Referring Physician(s): Dr Ardeth Sportsman  Supervising Physician: Jolaine Click  [facility] Status: [facility]  History of Present Illness: Suzanne Barrera is a 61 y.o. female   Hx Bipolar Multiple suicide attempts Found down at home unresponsive in [place], [place] and to ED with overdose PNA MRSA Intubated---[facility] for [facility] 04/12/2017 Now trach/vent Deconditioning Encephalopathic Need for long term care Request for percutaneous gastric tube placement Dr Loreta Ave has reviewed imaging and approves procedure  No past medical history on file.  Past Surgical History:  Procedure Laterality Date  . TRACHEOSTOMY TUBE PLACEMENT N/A 04/24/2017   Procedure: TRACHEOSTOMY;  Surgeon: Drema Halon, MD;  Location: [facility] OR;  Service: ENT;  Laterality: N/A;    Allergies: Patient has no allergy information on record.  Medications: Prior to Admission medications   Not on File     No family history on file.  Social History   Social History  . Marital status: Widowed    Spouse name: N/A  . Number of children: N/A  . Years of education: N/A   Social History Main Topics  . Smoking status: Not on file  . Smokeless tobacco: Not on file  . Alcohol use Not on file  . Drug use: Unknown  . Sexual activity: Not on file   Other Topics Concern  . Not on file   Social History Narrative  . No narrative on file    Review of Systems: A 12 point ROS discussed and pertinent positives are indicated in the HPI above.  All other systems are negative.  Review of Systems  Respiratory:       Trach/vent  Psychiatric/Behavioral: Positive for agitation.    Vital Signs: There were no vitals taken for this visit.  Physical Exam  Cardiovascular: Normal rate.   Pulmonary/Chest:  vent  Abdominal: Soft. Bowel sounds are normal.  Skin: Skin is  warm and dry.  Psychiatric:  Consented with Mother June Wall via phone  Nursing note and vitals reviewed.   Mallampati Score:  MD Evaluation Airway: Other (comments) Airway comments: trach/vent Heart: WNL Abdomen: WNL Chest/ Lungs: WNL ASA  Classification: 3 Mallampati/Airway Score: Three  Imaging: Ct Abdomen Wo Contrast  Result Date: 05/02/2017 CLINICAL DATA:  61 year old female with a history of dysphagia, referred for percutaneous gastrostomy tube EXAM: CT ABDOMEN WITHOUT CONTRAST TECHNIQUE: Multidetector CT imaging of the abdomen was performed following the standard protocol without IV contrast. COMPARISON:  None. FINDINGS: Lower chest: Atelectasis at the lung bases without evidence of acute abnormality. No pleural effusion. Hepatobiliary: Unremarkable appearance of visualized liver. Gallbladder is contracted with no radiopaque gallstones. No intrahepatic or extrahepatic biliary ductal dilatation. Pancreas: Unremarkable appearance of the pancreas Spleen: Unremarkable appearance of the spleen. Adrenals/Urinary Tract: Unremarkable appearance of the adrenal glands. Unremarkable appearance of the visualized kidneys without hydronephrosis or nephrolithiasis. No inflammatory changes. Stomach/Bowel: Hiatal hernia. Otherwise unremarkable stomach. There is no small bowel loops or colon loops overlying the stomach lumen. Gastric tube terminates post pyloric, within the second portion the duodenum. Vascular/Lymphatic: Unremarkable appearance of the visualized vasculature without significant calcifications. Minimal calcifications of the distal aorta. Other: No abdominal wall hernia. Musculoskeletal: No displaced fracture. Degenerative changes of the visualized spine. Vacuum disc phenomenon at L5-S1. Mild scoliotic curvature. IMPRESSION: No acute finding of the abdomen CT. Gastric tube terminates post pyloric, second portion the duodenum. Electronically Signed  By: Gilmer Mor D.O.   On: 05/02/2017  17:11   Dg Abd 1 View  Result Date: 04/29/2017 CLINICAL DATA:  Nasogastric tube placement. EXAM: ABDOMEN - 1 VIEW COMPARISON:  04/29/2017 at 12 p.m. FINDINGS: Nasogastric tube has been further inserted. The tube tip now projects in the right mid abdomen, likely within the duodenum. IMPRESSION: Nasogastric tube tip projects within the duodenum Electronically Signed   By: Amie Portland M.D.   On: 04/29/2017 16:07   Dg Chest Port 1 View  Result Date: 04/22/2017 CLINICAL DATA:  Respiratory failure.  Patient on ventilator. EXAM: PORTABLE CHEST 1 VIEW COMPARISON:  One-view chest x-ray is 04/21/2017 and 8/1/ 18. FINDINGS: The heart size is exaggerated by low lung volumes. Tracheal tube is stable and position, 5 cm above the carina. The side port of the NG tube is in the distal esophagus and could be advanced for more optimal positioning. The tip of the right IJ catheter is directed cephalad, as before. The position has been stable over the last 10 days. This is likely pulled back upon itself in the SVC or less likely in the azygos vein. Overall lung volumes are improved.  Mild edema remains. IMPRESSION: 1. Stable position of endotracheal tube. 2. The right IJ line remains bullet of the tip. The tip is directed cephalad either within the SVC or less likely in the azygos vein. 3. Improving aeration of both lungs with mild residual edema. Electronically Signed   By: Marin Roberts M.D.   On: 04/22/2017 07:54   Dg Chest Port 1 View  Result Date: 04/21/2017 CLINICAL DATA:  Respiratory failure and re-intubation. EXAM: PORTABLE CHEST 1 VIEW COMPARISON:  04/12/2017 FINDINGS: Endotracheal tube present with the tip approximately 3 cm above the carina. Orogastric tube extends below the diaphragm. Lungs show bibasilar atelectasis and potentially component of residual left lower lobe infiltrate. No edema, pneumothorax or significant pleural fluid identified. The heart size is within normal limits. IMPRESSION:  Endotracheal tube tip is approximately 3 cm above the carina. Bibasilar atelectasis present with potential left lower lobe infiltrate. Electronically Signed   By: Irish Lack M.D.   On: 04/21/2017 17:24   Dg Chest Port 1 View  Result Date: 04/12/2017 CLINICAL DATA:  New trans for admission. Intubated and nasogastric tube in place. EXAM: PORTABLE CHEST 1 VIEW COMPARISON:  None. FINDINGS: Mildly enlarged cardiac silhouette. Mild left lower lobe patchy opacity. Mildly prominent interstitial markings. Endotracheal tube tip 2.5 cm above the carina. Right jugular catheter tip folded back upon itself in the mid superior vena cava with its tip in the proximal superior vena cava or azygos vein. Nasogastric tube extending into the stomach with its side hole in the proximal to mid stomach. Unremarkable bones. IMPRESSION: 1. Mild cardiomegaly and mild left lower lobe atelectasis or pneumonia. 2. Mild chronic interstitial lung disease. 3. Right jugular catheter folded back upon itself in the superior vena cava with its tip in the azygos vein or proximal superior vena cava. Electronically Signed   By: Beckie Salts M.D.   On: 04/12/2017 19:01   Dg Abd Portable 1v  Result Date: 04/29/2017 CLINICAL DATA:  Nasogastric tube placement EXAM: PORTABLE ABDOMEN - 1 VIEW COMPARISON:  Yesterday FINDINGS: Nasogastric tube tip overlaps the stomach. The side port is near the GE junction. Incomplete visualization of the abdomen, excluding the right side, with normal visualized bowel gas pattern. IMPRESSION: Nasogastric tube tip is over the stomach and side port is near the GE junction. Electronically Signed  By: Marnee Spring M.D.   On: 04/29/2017 13:11   Dg Abd Portable 1v  Result Date: 04/28/2017 CLINICAL DATA:  NG tube placement. EXAM: PORTABLE ABDOMEN - 1 VIEW COMPARISON:  None. FINDINGS: NG tube is in place in good is with the side-port in the stomach. IMPRESSION: As above. Electronically Signed   By: Drusilla Kanner M.D.    On: 04/28/2017 15:59    Labs:  CBC:  Recent Labs  04/18/17 0723 04/22/17 0628 04/24/17 0526 04/30/17 0710  WBC 8.7 8.1 10.6* 13.7*  HGB 8.8* 9.1* 9.6* 10.5*  HCT 29.2* 29.1* 31.0* 34.5*  PLT 317 321 440* 769*    COAGS:  Recent Labs  04/13/17 0421 04/24/17 0526  INR 1.26 1.11    BMP:  Recent Labs  04/22/17 0628 04/24/17 0526 04/30/17 0710 05/01/17 0506 05/02/17 0539  NA 146* 146* 147* 146*  --   K 3.3* 3.4* 4.2 6.0* 3.6  CL 110 108 108 111  --   CO2 26 28 27 25   --   GLUCOSE 116* 117* 126* 127*  --   BUN 18 19 30* 34*  --   CALCIUM 9.0 9.0 9.2 9.0  --   CREATININE 1.57* 1.52* 1.48* 1.50*  --   GFRNONAA 34* 35* 37* 36*  --   GFRAA 39* 41* 42* 42*  --     LIVER FUNCTION TESTS:  Recent Labs  04/13/17 0421  BILITOT 0.6  AST 19  ALT 25  ALKPHOS 62  PROT 5.1*  ALBUMIN 2.2*    TUMOR MARKERS: No results for input(s): AFPTM, CEA, CA199, CHROMGRNA in the last 8760 hours.  Assessment and Plan:  Bipolar Deconditioning Encephalopathy Need for long term care Scheduled for percutaneous gastric tube placement 8/23 in IR Risks and benefits discussed with the patient including, but not limited to the need for a barium enema during the procedure, bleeding, infection, peritonitis, or damage to adjacent structures. All of the patient's questions were answered, patient is agreeable to proceed. Consent signed and in chart.  Thank you for this interesting consult.  I greatly enjoyed meeting Suzanne Barrera and look forward to participating in their care.  A copy of this report was sent to the requesting provider on this date.  Electronically Signed: Robet Leu, PA-C 05/03/2017, 9:25 AM   I spent a total of 40 minutes in face to face in clinical consultation, greater than 50% of which was counseling/coordinating care for percutaneous gastric tube placement

## 2017-05-08 ENCOUNTER — Other Ambulatory Visit (HOSPITAL_COMMUNITY): Payer: Medicare Other

## 2017-05-08 ENCOUNTER — Encounter (HOSPITAL_COMMUNITY): Payer: Self-pay | Admitting: Interventional Radiology

## 2017-05-08 HISTORY — PX: IR GASTROSTOMY TUBE MOD SED: IMG625

## 2017-05-08 LAB — CBC WITH DIFFERENTIAL/PLATELET
BASOS ABS: 0.1 10*3/uL (ref 0.0–0.1)
BASOS PCT: 0 %
Eosinophils Absolute: 0.9 10*3/uL — ABNORMAL HIGH (ref 0.0–0.7)
Eosinophils Relative: 8 %
HEMATOCRIT: 33.6 % — AB (ref 36.0–46.0)
Hemoglobin: 10.6 g/dL — ABNORMAL LOW (ref 12.0–15.0)
Lymphocytes Relative: 20 %
Lymphs Abs: 2.3 10*3/uL (ref 0.7–4.0)
MCH: 27.2 pg (ref 26.0–34.0)
MCHC: 31.5 g/dL (ref 30.0–36.0)
MCV: 86.4 fL (ref 78.0–100.0)
MONO ABS: 0.9 10*3/uL (ref 0.1–1.0)
Monocytes Relative: 7 %
NEUTROS ABS: 7.7 10*3/uL (ref 1.7–7.7)
NEUTROS PCT: 65 %
PLATELETS: 397 10*3/uL (ref 150–400)
RBC: 3.89 MIL/uL (ref 3.87–5.11)
RDW: 14.5 % (ref 11.5–15.5)
WBC: 11.9 10*3/uL — AB (ref 4.0–10.5)

## 2017-05-08 LAB — BASIC METABOLIC PANEL
ANION GAP: 11 (ref 5–15)
BUN: 18 mg/dL (ref 6–20)
CO2: 20 mmol/L — ABNORMAL LOW (ref 22–32)
CREATININE: 1.16 mg/dL — AB (ref 0.44–1.00)
Calcium: 9.2 mg/dL (ref 8.9–10.3)
Chloride: 107 mmol/L (ref 101–111)
GFR, EST AFRICAN AMERICAN: 57 mL/min — AB (ref >60)
GFR, EST NON AFRICAN AMERICAN: 49 mL/min — AB (ref >60)
GLUCOSE: 104 mg/dL — AB (ref 65–99)
POTASSIUM: 5.1 mmol/L (ref 3.5–5.1)
Sodium: 138 mmol/L (ref 135–145)

## 2017-05-08 MED ORDER — GLUCAGON HCL RDNA (DIAGNOSTIC) 1 MG IJ SOLR
INTRAMUSCULAR | Status: AC
  Filled 2017-05-08: qty 1

## 2017-05-08 MED ORDER — CEFAZOLIN SODIUM-DEXTROSE 2-4 GM/100ML-% IV SOLN
2.0000 g | Freq: Once | INTRAVENOUS | Status: AC
  Administered 2017-05-08: 2 g via INTRAVENOUS

## 2017-05-08 MED ORDER — FENTANYL CITRATE (PF) 100 MCG/2ML IJ SOLN
INTRAMUSCULAR | Status: AC
  Filled 2017-05-08: qty 4

## 2017-05-08 MED ORDER — FENTANYL CITRATE (PF) 100 MCG/2ML IJ SOLN
INTRAMUSCULAR | Status: AC | PRN
  Administered 2017-05-08 (×2): 50 ug via INTRAVENOUS

## 2017-05-08 MED ORDER — MIDAZOLAM HCL 2 MG/2ML IJ SOLN
INTRAMUSCULAR | Status: AC
  Filled 2017-05-08: qty 4

## 2017-05-08 MED ORDER — IOPAMIDOL (ISOVUE-300) INJECTION 61%
INTRAVENOUS | Status: AC
  Administered 2017-05-08: 20 mL
  Filled 2017-05-08: qty 50

## 2017-05-08 MED ORDER — CEFAZOLIN SODIUM-DEXTROSE 2-4 GM/100ML-% IV SOLN
INTRAVENOUS | Status: AC
  Administered 2017-05-08: 2 g via INTRAVENOUS
  Filled 2017-05-08: qty 100

## 2017-05-08 MED ORDER — LIDOCAINE HCL (PF) 1 % IJ SOLN
INTRAMUSCULAR | Status: AC
  Filled 2017-05-08: qty 30

## 2017-05-08 MED ORDER — MIDAZOLAM HCL 2 MG/2ML IJ SOLN
INTRAMUSCULAR | Status: AC | PRN
  Administered 2017-05-08 (×2): 1 mg via INTRAVENOUS

## 2017-05-08 MED ORDER — LIDOCAINE HCL (PF) 1 % IJ SOLN
INTRAMUSCULAR | Status: DC | PRN
  Administered 2017-05-08: 10 mL

## 2017-05-08 NOTE — Sedation Documentation (Signed)
Patient is resting comfortably. 

## 2017-05-08 NOTE — Procedures (Signed)
Interventional Radiology Procedure Note  Procedure: Placement of percutaneous 20F pull-through gastrostomy tube. Complications: None Recommendations: - NPO except for sips and chips remainder of today and overnight - Maintain G-tube to LWS x 4 hrs  - May advance diet as tolerated and begin using tube tomorrow morning  Signed,  Heath K. McCullough, MD   

## 2017-05-10 ENCOUNTER — Other Ambulatory Visit (HOSPITAL_COMMUNITY): Payer: Medicare Other

## 2017-05-13 LAB — BASIC METABOLIC PANEL
ANION GAP: 10 (ref 5–15)
BUN: 15 mg/dL (ref 6–20)
CALCIUM: 9.2 mg/dL (ref 8.9–10.3)
CO2: 26 mmol/L (ref 22–32)
Chloride: 101 mmol/L (ref 101–111)
Creatinine, Ser: 1.08 mg/dL — ABNORMAL HIGH (ref 0.44–1.00)
GFR, EST NON AFRICAN AMERICAN: 53 mL/min — AB (ref >60)
Glucose, Bld: 105 mg/dL — ABNORMAL HIGH (ref 65–99)
Potassium: 3.8 mmol/L (ref 3.5–5.1)
Sodium: 137 mmol/L (ref 135–145)

## 2017-05-13 LAB — CBC
HCT: 32 % — ABNORMAL LOW (ref 36.0–46.0)
HEMOGLOBIN: 10 g/dL — AB (ref 12.0–15.0)
MCH: 26.4 pg (ref 26.0–34.0)
MCHC: 31.3 g/dL (ref 30.0–36.0)
MCV: 84.4 fL (ref 78.0–100.0)
Platelets: 326 10*3/uL (ref 150–400)
RBC: 3.79 MIL/uL — ABNORMAL LOW (ref 3.87–5.11)
RDW: 14.1 % (ref 11.5–15.5)
WBC: 9.7 10*3/uL (ref 4.0–10.5)

## 2017-05-13 LAB — C DIFFICILE QUICK SCREEN W PCR REFLEX
C DIFFICILE (CDIFF) TOXIN: NEGATIVE
C Diff antigen: NEGATIVE
C Diff interpretation: NOT DETECTED

## 2017-05-14 ENCOUNTER — Other Ambulatory Visit (HOSPITAL_COMMUNITY): Payer: Medicare Other

## 2017-05-17 LAB — BASIC METABOLIC PANEL
Anion gap: 9 (ref 5–15)
BUN: 10 mg/dL (ref 6–20)
CHLORIDE: 102 mmol/L (ref 101–111)
CO2: 25 mmol/L (ref 22–32)
CREATININE: 1.1 mg/dL — AB (ref 0.44–1.00)
Calcium: 9 mg/dL (ref 8.9–10.3)
GFR, EST NON AFRICAN AMERICAN: 52 mL/min — AB (ref >60)
Glucose, Bld: 93 mg/dL (ref 65–99)
Potassium: 3.9 mmol/L (ref 3.5–5.1)
SODIUM: 136 mmol/L (ref 135–145)

## 2017-05-17 LAB — CBC
HCT: 31.9 % — ABNORMAL LOW (ref 36.0–46.0)
HEMOGLOBIN: 9.8 g/dL — AB (ref 12.0–15.0)
MCH: 26.1 pg (ref 26.0–34.0)
MCHC: 30.7 g/dL (ref 30.0–36.0)
MCV: 84.8 fL (ref 78.0–100.0)
PLATELETS: 348 10*3/uL (ref 150–400)
RBC: 3.76 MIL/uL — AB (ref 3.87–5.11)
RDW: 14.2 % (ref 11.5–15.5)
WBC: 7.9 10*3/uL (ref 4.0–10.5)

## 2018-03-26 IMAGING — XA IR PERC PLACEMENT GASTROSTOMY
2 series · 2 of 2 positions shown · non-contrast
Comparison: none

INDICATION: 61-year-old female with ventilator dependent respiratory failure,
dysphagia and protein calorie malnutrition. She presents for
percutaneous gastrostomy tube placement.

[Series 1: fl (-) angio · 1 of 1 slices shown (1 of 2)]
[im 1/1]
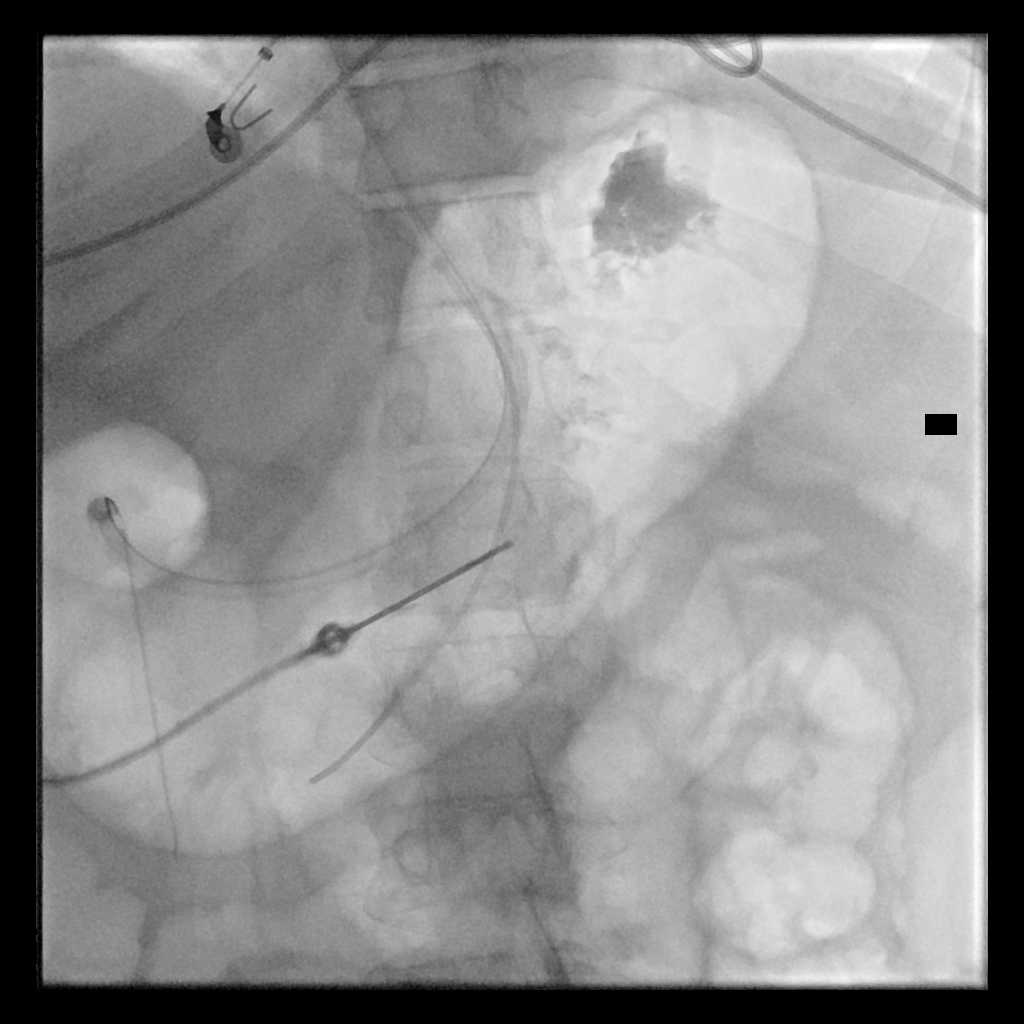

[Series 2: fl (-) angio · 1 of 1 slices shown (2 of 2)]
[im 1/1]
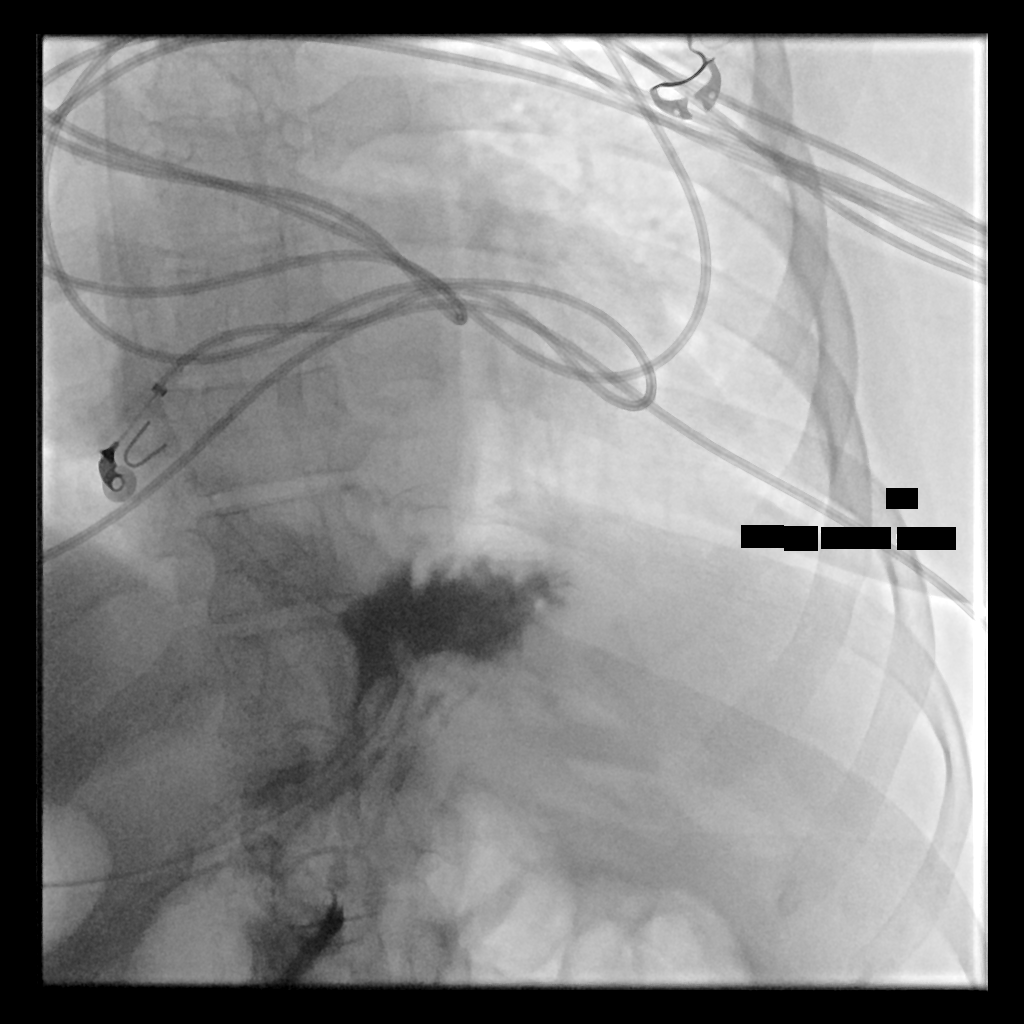

[2 of 2 positions shown; findings below may reference images not displayed]

EXAM:
Fluoroscopically guided placement of percutaneous pull-through
gastrostomy tube

MEDICATIONS:
2 g Ancef; Antibiotics were administered within 1 hour of the
procedure.

ANESTHESIA/SEDATION:
Versed 2 mg IV; Fentanyl 100 mcg IV

Moderate Sedation Time:  10 minutes

The patient was continuously monitored during the procedure by the
interventional radiology nurse under my direct supervision.

CONTRAST:  20 mL Isovue 370

FLUOROSCOPY TIME:  Fluoroscopy Time: 1 minutes 18 seconds (8 mGy).

COMPLICATIONS:
None immediate.

PROCEDURE:
Informed written consent was obtained from the patient after a
thorough discussion of the procedural risks, benefits and
alternatives. All questions were addressed. Maximal Sterile Barrier
Technique was utilized including caps, mask, sterile gowns, sterile
gloves, sterile drape, hand hygiene and skin antiseptic. A timeout
was performed prior to the initiation of the procedure.

Maximal barrier sterile technique utilized including caps, mask,
sterile gowns, sterile gloves, large sterile drape, hand hygiene,
and chlorhexadine skin prep.

An angled catheter was advanced over a wire under fluoroscopic
guidance through the nose, down the esophagus and into the body of
the stomach. The stomach was then insufflated with several 100 ml of
air. Fluoroscopy confirmed location of the gastric bubble, as well
as inferior displacement of the barium stained colon. Under direct
fluoroscopic guidance, a single T-tack was placed, and the anterior
gastric wall drawn up against the anterior abdominal wall.
Percutaneous access was then obtained into the mid gastric body with
an 18 gauge sheath needle. Aspiration of air, and injection of
contrast material under fluoroscopy confirmed needle placement.

An Amplatz wire was advanced in the gastric body and the access
needle exchanged for a 9-French vascular sheath. A snare device was
advanced through the vascular sheath and an Amplatz wire advanced
through the angled catheter. The Amplatz wire was successfully
snared and this was pulled up through the esophagus and out the
mouth. A 20-Lusine Jim tube was then connected to
the snare and pulled through the mouth, down the esophagus, into the
stomach and out to the anterior abdominal wall. Hand injection of
contrast material confirmed intragastric location. The T-tack
retention suture was then cut. The pull through peg tube was then
secured with the external bumper and capped.

The patient will be observed for several hours with the newly placed
tube on low wall suction to evaluate for any post procedure
complication. The patient tolerated the procedure well, there is no
immediate complication.
IMPRESSION: Successful placement of a 20 French pull through gastrostomy tube.

## 2018-03-28 IMAGING — RF DG SWALLOWING FUNCTION - NRPT [facility]
1 series · 18 of 24 positions shown · non-contrast
Comparison: none

[Series 1: run · 14 acquisitions, 18 frames shown]
[im 1/14]
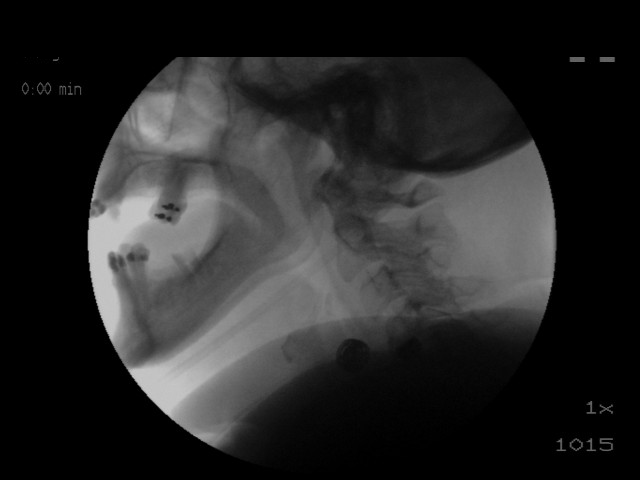
[im 2/14]
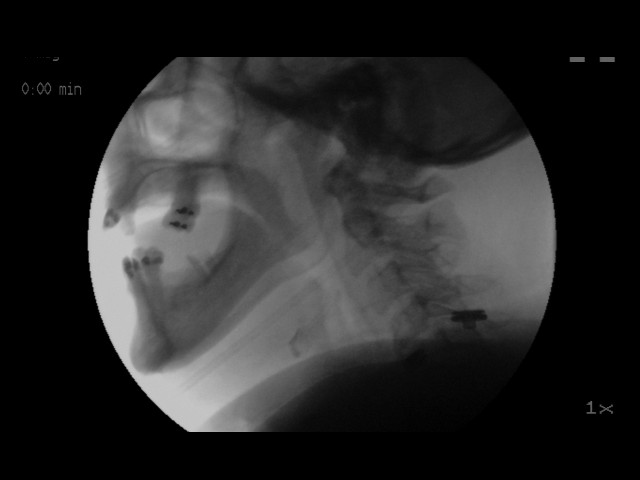
[im 2/14]
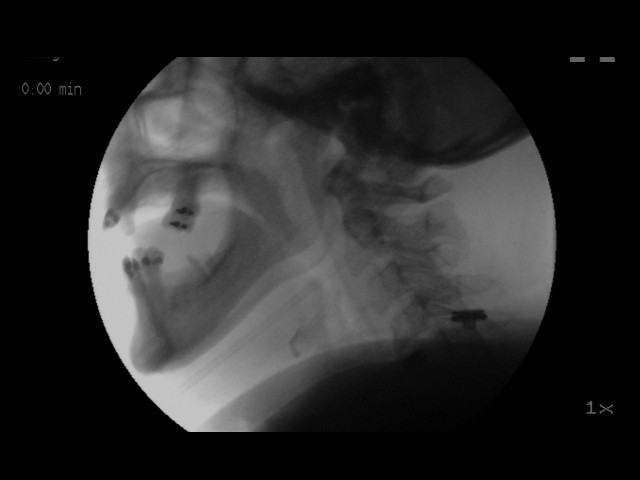
[im 3/14]
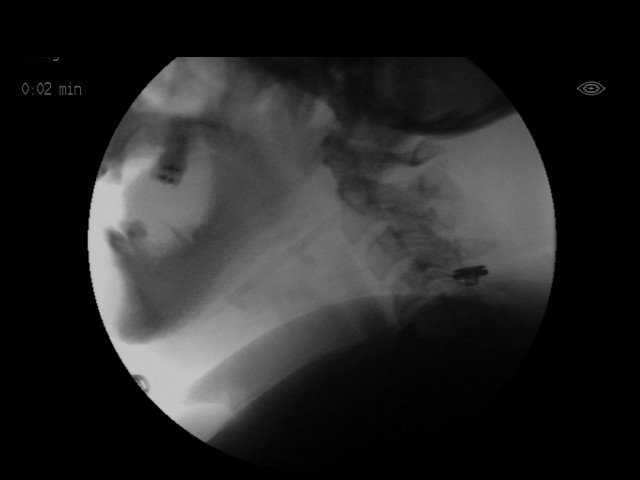
[im 4/14]
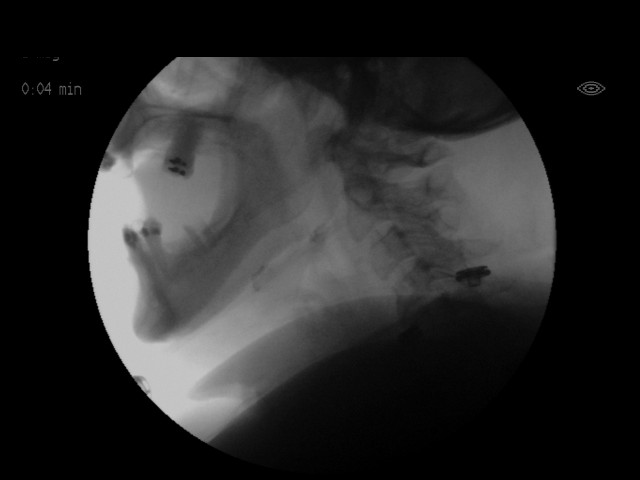
[im 5/14]
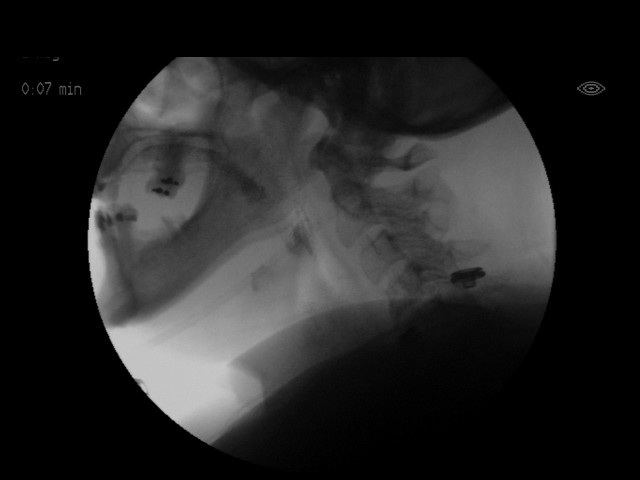
[im 5/14]
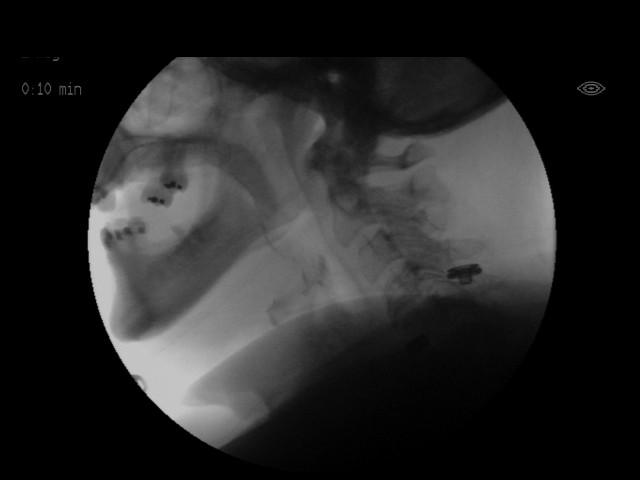
[im 7/14]
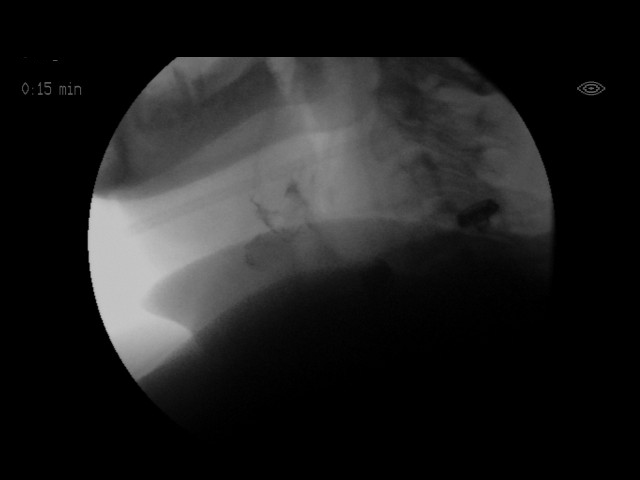
[im 7/14]
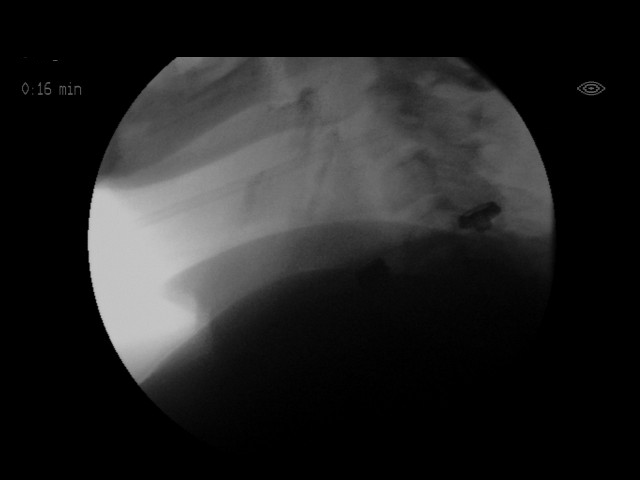
[im 8/14]
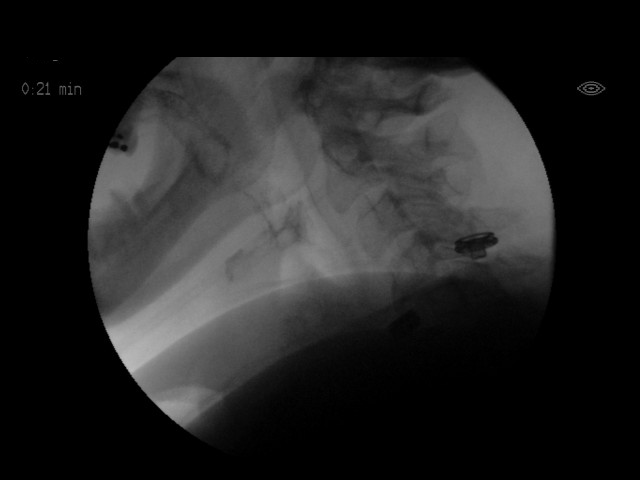
[im 9/14]
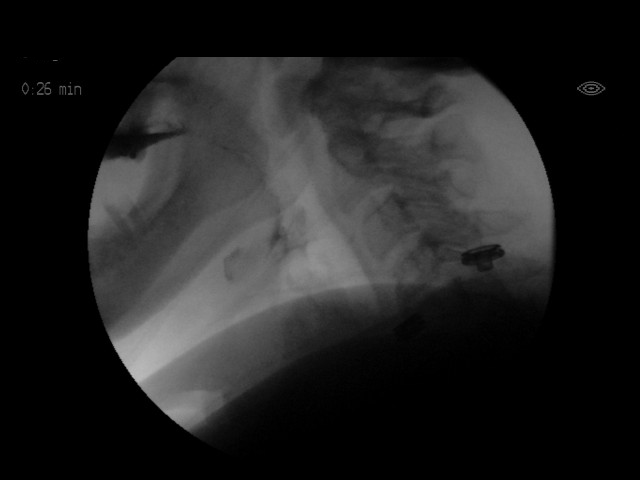
[im 10/14]
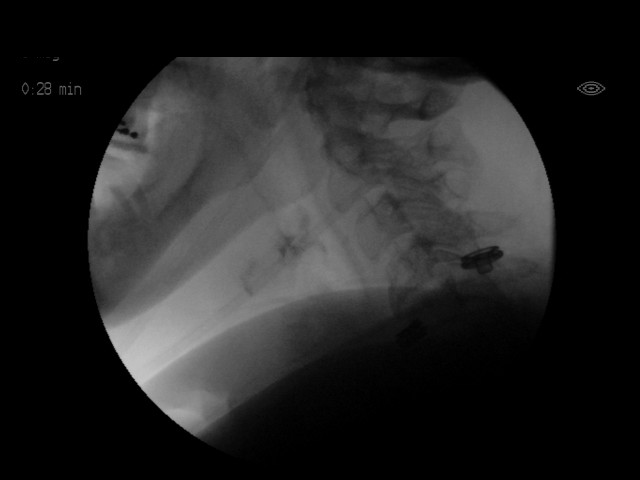
[im 10/14]
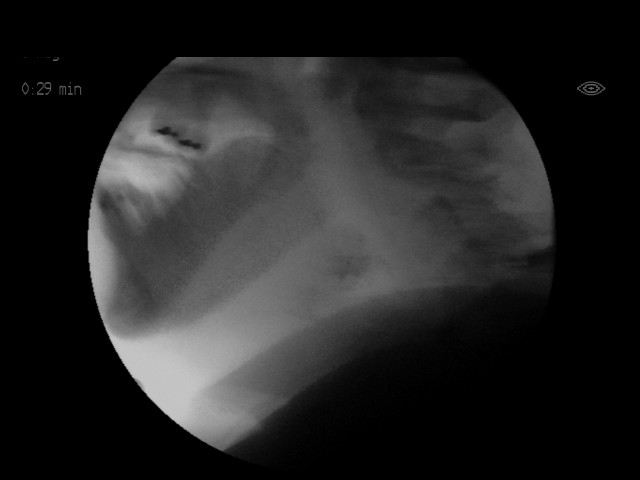
[im 11/14]
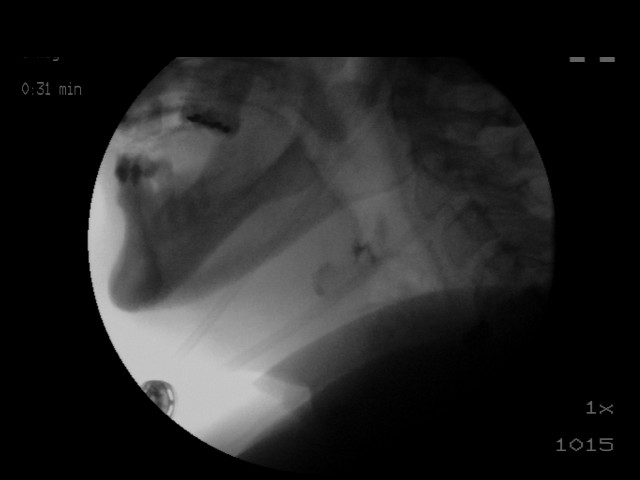
[im 12/14]
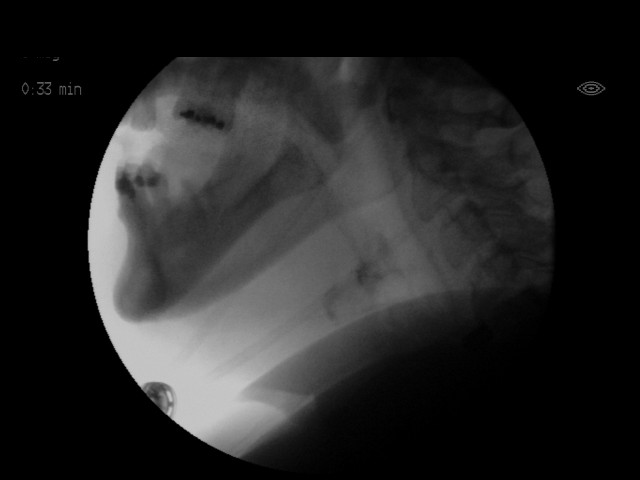
[im 13/14]
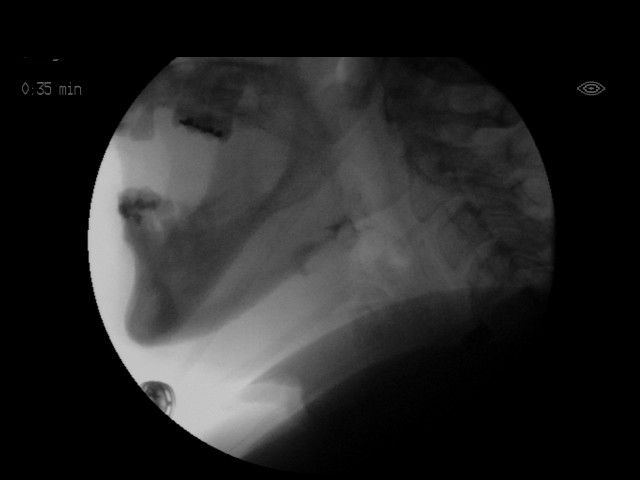
[im 14/14]
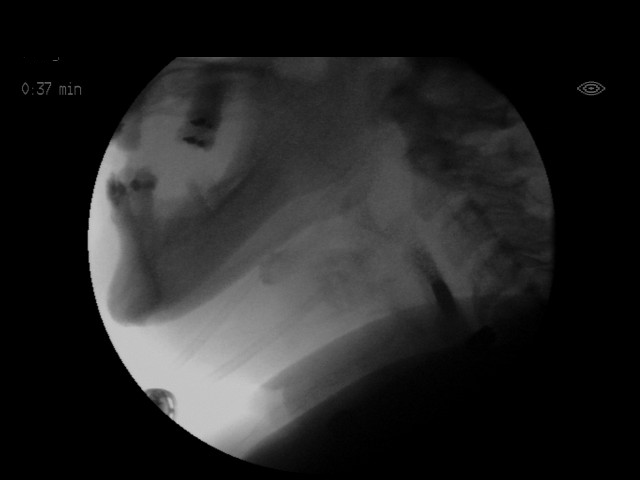
[im 14/14]
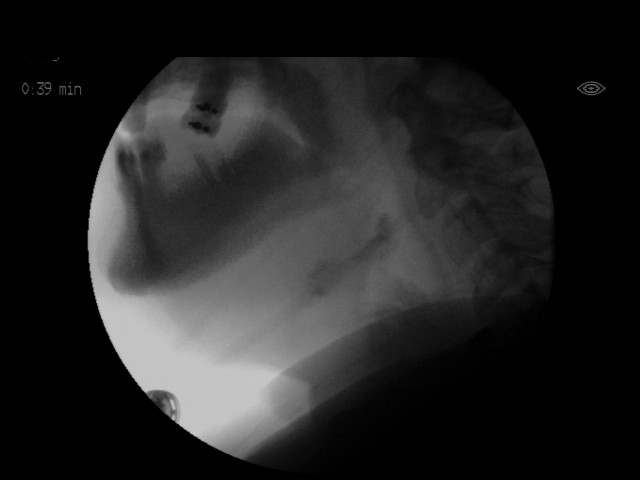

[18 of 24 positions shown; findings below may reference images not displayed]

FLUOROSCOPY FOR SWALLOWING FUNCTION STUDY:
Fluoroscopy was provided for swallowing function study, which was administered by a speech pathologist.  Final results and recommendations from this study are contained within the speech pathology report.

## 2019-08-13 DEATH — deceased
